# Patient Record
Sex: Male | Born: 2005 | Race: White | Hispanic: No | Marital: Single | Smoking: Never smoker
Health system: Southern US, Community
[De-identification: ages and names within clinical notes are randomized; demographics above are authoritative.]

## PROBLEM LIST (undated history)

## (undated) DIAGNOSIS — L709 Acne, unspecified: Secondary | ICD-10-CM

## (undated) DIAGNOSIS — M419 Scoliosis, unspecified: Secondary | ICD-10-CM

## (undated) HISTORY — DX: Scoliosis, unspecified: M41.9

## (undated) HISTORY — DX: Acne, unspecified: L70.9

---
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
## 2024-08-17 ENCOUNTER — Ambulatory Visit (INDEPENDENT_AMBULATORY_CARE_PROVIDER_SITE_OTHER): Payer: Self-pay

## 2024-08-17 VITALS — BP 132/82 | HR 78 | Ht 70.5 in | Wt 152.0 lb

## 2024-08-17 DIAGNOSIS — Z025 Encounter for examination for participation in sport: Secondary | ICD-10-CM

## 2024-08-17 NOTE — Progress Notes (Signed)
  PCP: No primary care provider on file.  Subjective:   HPI: Patient is a 18 y.o. male here for a physical exam required by the [address] of [address] [facility] [employer] for [employer].  Walked through patient's history including a surgery when he was 7 for a piece of glass stuck in his foot which has resolved on any complications.  A remote history of someone mentioning scoliosis as a middle school athlete, but then resolved in high school with no imaging or complications.  Patient has had no prolonged back pain or issues.  He denies any issues of mental instability or mental health challenges.  He plays basketball usually once a week and handles this well although he sometimes gets headaches after playing for a few hours likely due to dehydration.  There are no other surgeries, mental health, or physical health complications according to patient.  Requirements for a mission physical from the [place] of [address] [facility] [employer] include a hemoglobin and hematocrit, TB skin test and immunization history.  Past Medical History:  Diagnosis Date   Acne 10/20/2017   Scoliosis 04/22/2017    Current Outpatient Medications on File Prior to Visit  Medication Sig Dispense Refill   clindamycin -benzoyl peroxide (BENZACLIN) gel Apply topically 2 (two) times daily. 50 g 12   Multiple Vitamin (MULTIVITAMIN) capsule Take 1 capsule by mouth daily.     No current facility-administered medications on file prior to visit.    BP 132/82   Pulse 78   Ht 5' 10.5 (1.791 m)   Wt 152 lb (68.9 kg)   BMI 21.50 kg/m        Objective:   Physical Exam:  Gen: Well developed, well nourished male in no acute distress. HEENT: Pupils equal, round, and reactive to light.  Conjunctiva non-injected.  Nares patent without discharge.  Oral mucosa is moist and pink.  Posterior pharynx clear without erythema.  No palpable lymphadenopathy. CV: Regular rate and rhythm without murmurs, gallops, or  rubs. Lungs: Clear to auscultation bilaterally with good effort GI: Soft, non-tender, non-distended, positive bowel sounds. Skin: No rash, breakdown or sores MSK: Joints and muscles are symmetrical with no swelling, redness, or deformity. Neuro: No weakness, sensory changes or other neurological findings of the bilateral upper or lower extremities or face  Ext: No cyanosis, clubbing, or edema.   Assessment/Plan:   Daylin Gruszka is a 18 y.o. male who was seen today for the following: 1. 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   May return sooner as needed and encouraged to call/e-mail for additional questions or  worsening symptoms in the interim.  Krystal Lowing, DO Sports Medicine Fellow 08/17/2024 12:40 PM

## 2024-08-18 LAB — CBC WITH DIFFERENTIAL/PLATELET
Basophils Absolute: 0.1 x10E3/uL (ref 0.0–0.2)
Basos: 1 %
EOS (ABSOLUTE): 0.1 x10E3/uL (ref 0.0–0.4)
Eos: 1 %
Hematocrit: 46.5 % (ref 37.5–51.0)
Hemoglobin: 15.5 g/dL (ref 13.0–17.7)
Immature Grans (Abs): 0 x10E3/uL (ref 0.0–0.1)
Immature Granulocytes: 0 %
Lymphocytes Absolute: 2.2 x10E3/uL (ref 0.7–3.1)
Lymphs: 33 %
MCH: 28 pg (ref 26.6–33.0)
MCHC: 33.3 g/dL (ref 31.5–35.7)
MCV: 84 fL (ref 79–97)
Monocytes Absolute: 0.5 x10E3/uL (ref 0.1–0.9)
Monocytes: 7 %
Neutrophils Absolute: 3.9 x10E3/uL (ref 1.4–7.0)
Neutrophils: 58 %
Platelets: 262 x10E3/uL (ref 150–450)
RBC: 5.54 x10E6/uL (ref 4.14–5.80)
RDW: 12.2 % (ref 11.6–15.4)
WBC: 6.7 x10E3/uL (ref 3.4–10.8)
